# Patient Record
Sex: Male | Born: 1940 | Race: White | Hispanic: No | Marital: Married | Smoking: Never smoker
Health system: Southern US, Community
[De-identification: ages and names within clinical notes are randomized; demographics above are authoritative.]

## PROBLEM LIST (undated history)

## (undated) DIAGNOSIS — E785 Hyperlipidemia, unspecified: Secondary | ICD-10-CM

## (undated) DIAGNOSIS — I10 Essential (primary) hypertension: Secondary | ICD-10-CM

## (undated) HISTORY — DX: Hyperlipidemia, unspecified: E78.5

## (undated) HISTORY — DX: Essential (primary) hypertension: I10

## (undated) HISTORY — PX: PROSTATE CRYOABLATION: SUR358

---

## 2007-01-25 ENCOUNTER — Ambulatory Visit: Payer: Self-pay | Admitting: Urology

## 2007-03-30 ENCOUNTER — Ambulatory Visit: Payer: Self-pay | Admitting: Urology

## 2007-03-30 ENCOUNTER — Other Ambulatory Visit: Payer: Self-pay

## 2007-04-06 ENCOUNTER — Ambulatory Visit: Payer: Self-pay | Admitting: Urology

## 2008-02-02 IMAGING — NM NUCLEAR MEDICINE WHOLE BODY BONE SCINTIGRAPHY
1 series · 2 of 2 positions shown · non-contrast
Comparison: none

RESULT:      The patient received an injection of 21.4 mCi of Tc 99m MDP.
Anterior and posterior whole-body images demonstrate increased localization
diffusely in the knees and ankles suggestive of degenerative change. On the
posterior view on the left in what appears to be the 4th rib there is some
mild heterogeneity with increased localization. This is nonspecific and
certainly could be secondary to trauma or previous surgery.  The possibility
of metastatic disease in this location cannot be excluded.

[Series 1: 3 hr wholebody · 2.40mm/px · 2 of 2 frames shown]
[frame 1/2]
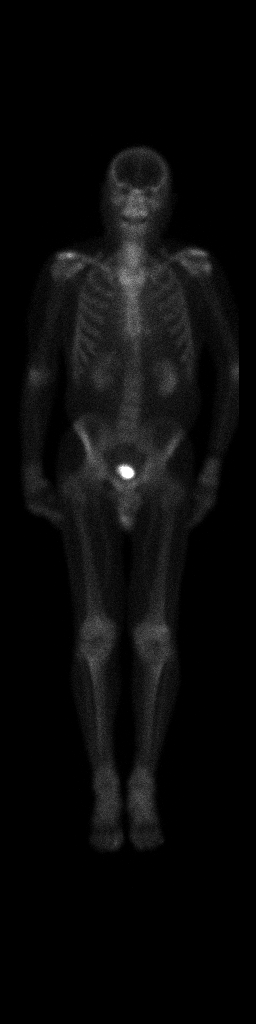
[frame 2/2]
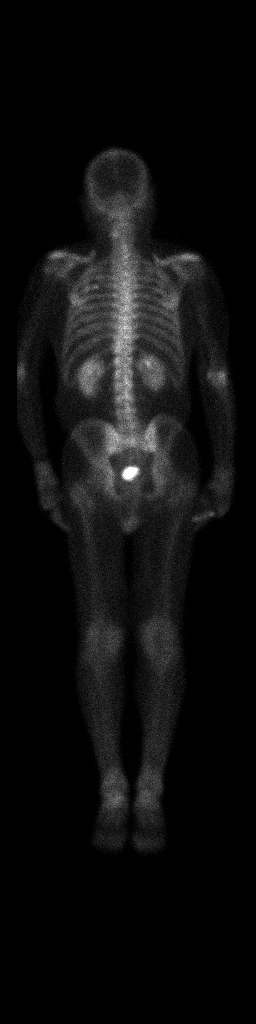

[2 of 2 positions shown; findings below may reference images not displayed]

IMPRESSION: 1.     Increased localization around the posterior left 4th rib as
described. This is nonspecific. Correlation with plain films and history may
be beneficial.

## 2008-02-10 ENCOUNTER — Emergency Department: Payer: Self-pay | Admitting: Emergency Medicine

## 2011-02-05 ENCOUNTER — Emergency Department: Payer: Self-pay | Admitting: Emergency Medicine

## 2011-02-14 ENCOUNTER — Inpatient Hospital Stay: Payer: Self-pay | Admitting: Specialist

## 2011-02-18 LAB — PATHOLOGY REPORT

## 2011-05-20 ENCOUNTER — Ambulatory Visit: Payer: Self-pay | Admitting: Unknown Physician Specialty

## 2011-05-22 LAB — PATHOLOGY REPORT

## 2011-06-16 ENCOUNTER — Ambulatory Visit: Payer: Self-pay | Admitting: Specialist

## 2011-06-23 ENCOUNTER — Ambulatory Visit: Payer: Self-pay | Admitting: Specialist

## 2012-02-22 IMAGING — CR DG CHEST 1V
1 series · 1 of 1 positions shown · non-contrast
Comparison: none

REASON FOR EXAM: tachycardia
COMMENTS:

PROCEDURE:     DXR - DXR CHEST 1 VIEWAP OR PA  - February 14, 2011  [DATE]
RESULT:     Comparison: None

[view not recorded]
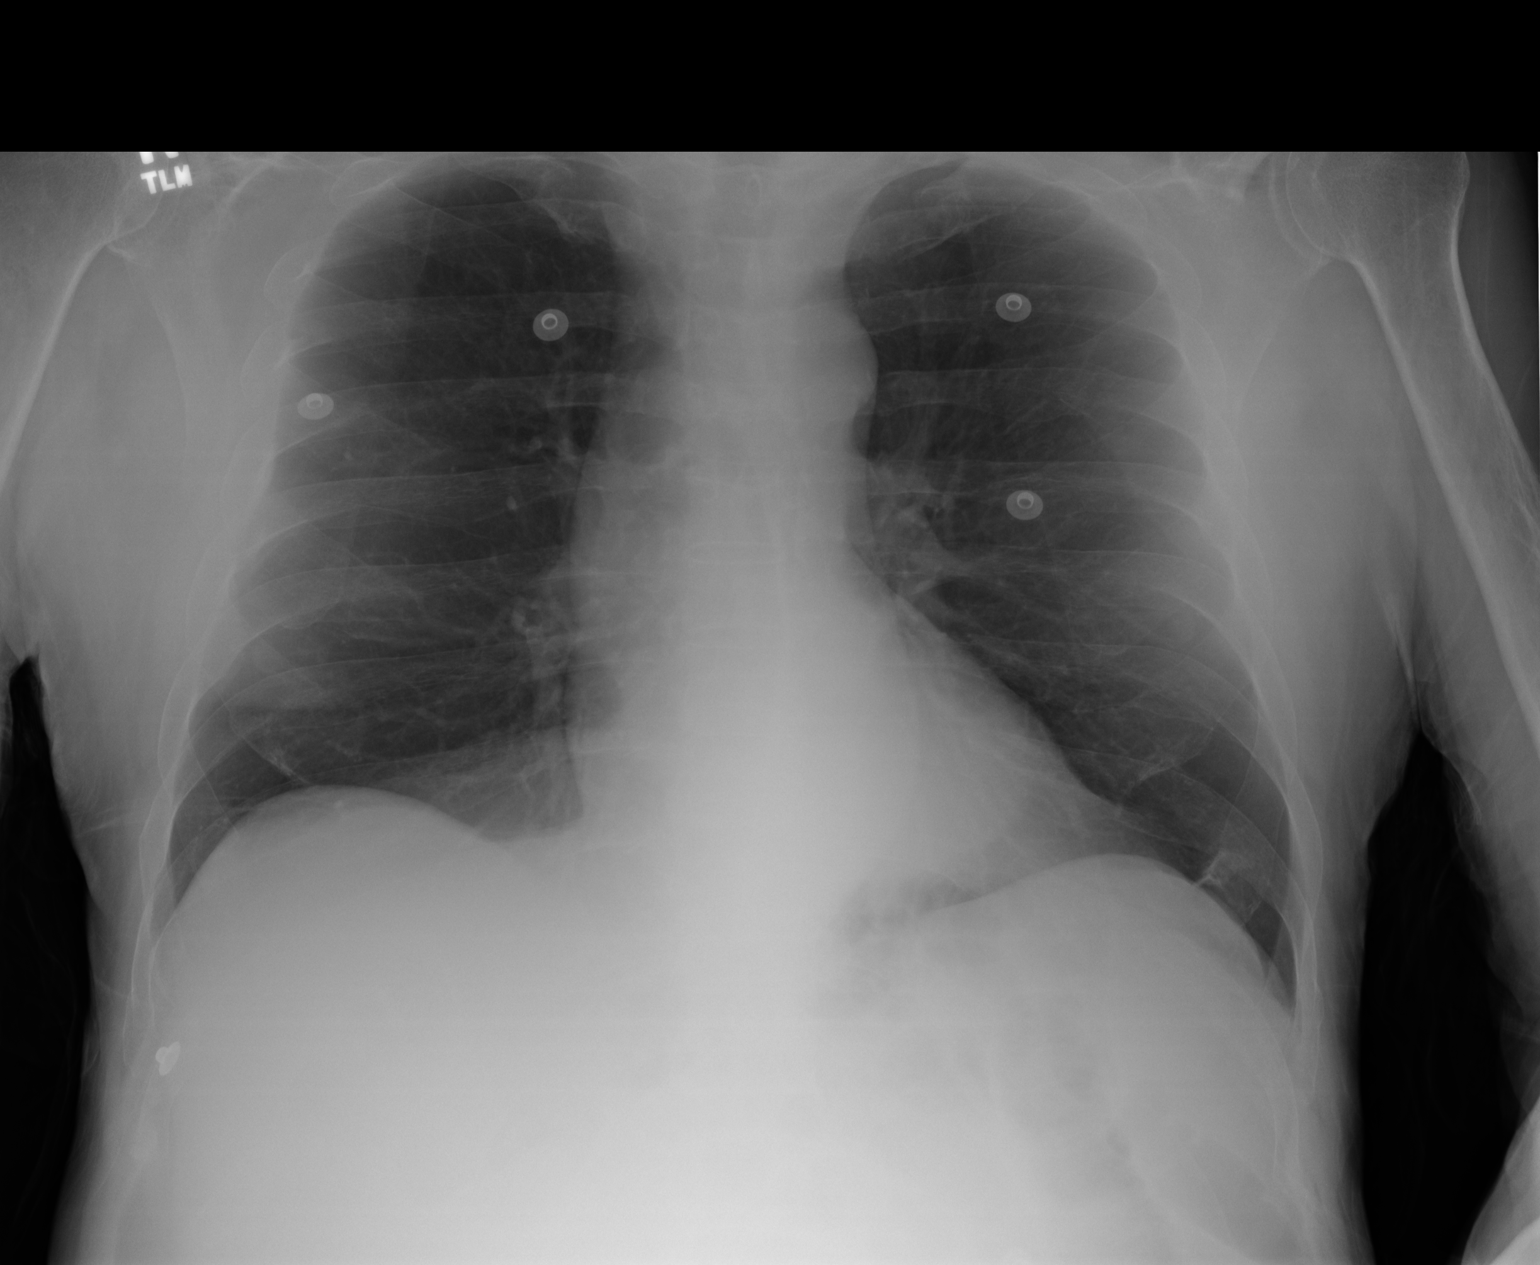

[1 of 1 positions shown; findings below may reference images not displayed]

FINDINGS: Single portable AP chest radiograph is provided.  There is no focal
parenchymal opacity, pleural effusion, or pneumothorax. Normal
cardiomediastinal silhouette. The osseous structures are unremarkable.
IMPRESSION: No acute disease of the chest.

## 2012-06-01 ENCOUNTER — Ambulatory Visit: Payer: Self-pay | Admitting: Ophthalmology

## 2012-06-01 DIAGNOSIS — I499 Cardiac arrhythmia, unspecified: Secondary | ICD-10-CM

## 2012-06-14 ENCOUNTER — Ambulatory Visit: Payer: Self-pay | Admitting: Ophthalmology

## 2012-08-03 ENCOUNTER — Ambulatory Visit: Payer: Self-pay | Admitting: Ophthalmology

## 2014-08-30 ENCOUNTER — Ambulatory Visit: Payer: Self-pay | Admitting: Unknown Physician Specialty

## 2014-08-31 LAB — PATHOLOGY REPORT

## 2015-04-03 NOTE — Op Note (Signed)
PATIENT NAME:  Jason PeterCLENDENIN, [name] MR#:  [id number] DATE OF BIRTH:  [birth year]  DATE OF PROCEDURE:  08/03/2012  PREOPERATIVE DIAGNOSIS: Visually significant cataract of the right eye.   POSTOPERATIVE DIAGNOSIS: Visually significant cataract of the right eye.   OPERATIVE PROCEDURE: Cataract extraction by phacoemulsification with implant of intraocular lens to right eye.   SURGEON: Galen ManilaWilliam Mischele Detter, MD.   ANESTHESIA:  1. Managed anesthesia care.  2. Topical tetracaine drops followed by 2% Xylocaine jelly applied in the preoperative holding area.   COMPLICATIONS: None.   TECHNIQUE:  Stop-and-chop    DESCRIPTION OF PROCEDURE: The patient was examined and consented in the preoperative holding area where the aforementioned topical anesthesia was applied to the right eye and then brought back to the Operating Room where the right eye was prepped and draped in the usual sterile ophthalmic fashion and a lid speculum was placed. A paracentesis was created with the side port blade and the anterior chamber was filled with viscoelastic. A near clear corneal incision was performed with the steel keratome. A continuous curvilinear capsulorrhexis was performed with a cystotome followed by the capsulorrhexis forceps. Hydrodissection and hydrodelineation were carried out with BSS on a blunt cannula. The lens was removed in a stop-and-chop technique and the remaining cortical material was removed with the irrigation-aspiration handpiece. The capsular bag was inflated with viscoelastic and the Technus ZCB00 19.5-diopter lens, serial number [id number] was placed in the capsular bag without complication. The remaining viscoelastic was removed from the eye with the irrigation-aspiration handpiece. The wounds were hydrated. The anterior chamber was flushed with Miostat and the eye was inflated to physiologic pressure. The wounds were found to be water tight. The eye was dressed with Vigamox. The patient was given  protective glasses to wear throughout the day and a shield with which to sleep tonight. The patient was also given drops with which to begin a drop regimen today and will follow-up with me in one day.   ____________________________ Jerilee FieldWilliam L. Seif Teichert, MD wlp:drc D: 08/03/2012 12:29:24 ET T: 08/03/2012 12:41:49 ET JOB#: [id number]  cc: Ritvik Mczeal L. Izzabella Besse, MD, <Dictator> Jerilee FieldWILLIAM L Adra Shepler MD ELECTRONICALLY SIGNED 08/05/2012 17:17

## 2015-04-08 NOTE — Op Note (Signed)
PATIENT NAME:  Jason [name], Jason [name] MR#:  [id number] DATE OF BIRTH:  1941-03-18  DATE OF PROCEDURE:  06/14/2012  PREOPERATIVE DIAGNOSIS: Visually significant cataract of the left eye.   POSTOPERATIVE DIAGNOSIS: Visually significant cataract of the left eye.   OPERATIVE PROCEDURE: Cataract extraction by phacoemulsification with implant of intraocular lens to the left eye.   SURGEON: Galen ManilaWilliam Hedy Garro, MD  ANESTHESIA:  1. Managed anesthesia care.  2. 50-50 mixture of 0.75% bupivacaine and 4% Xylocaine given as a retrobulbar block.   COMPLICATIONS: None.   TECHNIQUE:  Stop and chop.  DESCRIPTION OF PROCEDURE: The patient was examined and consented for this procedure in the preoperative holding area and then brought back to the Operating Room where the anesthesia team employed managed anesthesia care.  3.5 milliliters of the aforementioned mixture were placed in the left orbit on an Atkinson needle without complication. The left eye was then prepped and draped in the usual sterile ophthalmic fashion. A lid speculum was placed. The side-port blade was used to create a paracentesis and the anterior chamber was filled with viscoelastic. The keratome was used to create a near clear corneal incision. The continuous curvilinear capsulorrhexis was performed with a cystotome followed by the capsulorrhexis forceps. Hydrodissection and hydrodelineation were carried out with BSS on a blunt cannula. The lens was removed in a stop and chop technique. The remaining cortical material was removed with the irrigation-aspiration handpiece. The capsular bag was inflated with viscoelastic and the Technis ZCBOO 19.0-diopter lens, serial number [id number] was placed in the capsular bag without complication. The remaining viscoelastic was removed from the eye with the irrigation-aspiration handpiece. The wounds were hydrated. The anterior chamber was flushed with Miostat. The eye was inflated to a physiologic pressure and  the wounds were found to be water tight. The eye was dressed with Vigamox followed by Maxitrol ointment and a protective shield was placed.  The patient will followup with me in one day.   ____________________________ Jerilee FieldWilliam L. Nikia Levels, MD wlp:cbb D: 06/14/2012 17:36:07 ET [name]: 06/15/2012 10:07:50 ET JOB#: [id number]  cc: Zonia Caplin L. Babyboy Loya, MD, <Dictator> Jerilee FieldWILLIAM L Vanissa Strength MD ELECTRONICALLY SIGNED 06/23/2012 9:58

## 2017-06-08 ENCOUNTER — Ambulatory Visit (INDEPENDENT_AMBULATORY_CARE_PROVIDER_SITE_OTHER): Payer: Medicare Other | Admitting: Urology

## 2017-06-08 ENCOUNTER — Encounter: Payer: Self-pay | Admitting: Urology

## 2017-06-08 VITALS — BP 162/77 | HR 78 | Ht 65.0 in | Wt 141.3 lb

## 2017-06-08 DIAGNOSIS — N390 Urinary tract infection, site not specified: Secondary | ICD-10-CM | POA: Diagnosis not present

## 2017-06-08 LAB — URINALYSIS, COMPLETE
Bilirubin, UA: NEGATIVE
GLUCOSE, UA: NEGATIVE
KETONES UA: NEGATIVE
LEUKOCYTES UA: NEGATIVE
NITRITE UA: NEGATIVE
Protein, UA: NEGATIVE
RBC UA: NEGATIVE
SPEC GRAV UA: 1.015 (ref 1.005–1.030)
Urobilinogen, Ur: 0.2 mg/dL (ref 0.2–1.0)
pH, UA: 5.5 (ref 5.0–7.5)

## 2017-06-08 NOTE — Progress Notes (Signed)
06/08/2017 2:02 PM   Jason PeterKenneth T [name] [birth year] [id number]  Referring provider: No referring provider defined for this encounter.  Chief Complaint  Patient presents with  . Recurrent UTI    HPI: The patient is followed by Dr. Achilles Dunkope who we just saw 2 weeks ago. He describes a bladder infection in April with dysuria and severe frequency. He developed a reaction to ciprofloxacin. 2 weeks later he had nonspecific penile pain with foul smelling urine I responded to a second antibiotic.  He had cryotherapy for prostate cancer [facility] and recently had a PSA.  At baseline he voids every 2 or 3 hours and gets up once or twice a night. His primary complaint today is more of a tangle in the penis after he urinates or during urination  Modifying factors: There are no other modifying factors  Associated signs and symptoms: There are no other associated signs and symptoms Aggravating and relieving factors: There are no other aggravating or relieving factors Severity: Moderate Duration: Persistent     PMH: Past Medical History:  Diagnosis Date  . Hyperlipidemia   . Hypertension     Surgical History: Past Surgical History:  Procedure Laterality Date  . PROSTATE CRYOABLATION      Home Medications:  Allergies as of 06/08/2017      Reactions   Ace Inhibitors    Other reaction(s): Other (See Comments) Increases potassium Pt states he is not allergic to these   Ciprofloxacin Other (See Comments)   Hallucinations   Influenza Vaccines Other (See Comments)   Felt horrible      Medication List       Accurate as of 06/08/17  2:02 PM. Always use your most recent med list.          metoprolol tartrate 50 MG tablet Commonly known as:  LOPRESSOR Take 50 mg by mouth 2 (two) times daily.   sildenafil 20 MG tablet Commonly known as:  REVATIO Take 20 mg by mouth 3 (three) times daily.   simvastatin 40 MG tablet Commonly known as:   ZOCOR Take 40 mg by mouth daily.       Allergies:  Allergies  Allergen Reactions  . Ace Inhibitors     Other reaction(s): Other (See Comments) Increases potassium  Pt states he is not allergic to these  . Ciprofloxacin Other (See Comments)    Hallucinations  . Influenza Vaccines Other (See Comments)    Felt horrible    Family History: Family History  Problem Relation Age of Onset  . Prostate cancer Neg Hx   . Bladder Cancer Neg Hx   . Kidney cancer Neg Hx     Social History:  reports that he has never smoked. He has never used smokeless tobacco. He reports that he does not drink alcohol or use drugs.  ROS: UROLOGY Frequent Urination?: No Hard to postpone urination?: No Burning/pain with urination?: No Get up at night to urinate?: No Leakage of urine?: No Urine stream starts and stops?: No Trouble starting stream?: No Do you have to strain to urinate?: No Blood in urine?: No Urinary tract infection?: Yes Sexually transmitted disease?: No Injury to kidneys or bladder?: No Painful intercourse?: No Weak stream?: No Erection problems?: Yes Penile pain?: Yes  Gastrointestinal Nausea?: Yes Vomiting?: Yes Indigestion/heartburn?: Yes Diarrhea?: Yes Constipation?: No  Constitutional Fever: No Night sweats?: No Weight loss?: No Fatigue?: No  Skin Skin rash/lesions?: No Itching?: No  Eyes Blurred vision?: No  Double vision?: No  Ears/Nose/Throat Sore throat?: No Sinus problems?: No  Hematologic/Lymphatic Swollen glands?: No Easy bruising?: No  Cardiovascular Leg swelling?: No Chest pain?: No  Respiratory Cough?: No Shortness of breath?: No  Endocrine Excessive thirst?: No  Musculoskeletal Back pain?: No Joint pain?: No  Neurological Headaches?: No Dizziness?: No  Psychologic Depression?: No Anxiety?: No  Physical Exam: BP (!) 162/77 (BP Location: Left Arm, Patient Position: Sitting, Cuff Size: Normal)   Pulse 78   Ht 5\' 5"   (1.651 m)   Wt 141 lb 4.8 oz (64.1 kg)   BMI 23.51 kg/m   Constitutional:  Alert and oriented, No acute distress. HEENT: [place] AT, moist mucus membranes.  Trachea midline, no masses. Cardiovascular: No clubbing, cyanosis, or edema. Respiratory: Normal respiratory effort, no increased work of breathing. GI: Abdomen is soft, nontender, nondistended, no abdominal masses GU: No CVA tenderness. Normal genitalia Skin: No rashes, bruises or suspicious lesions. Lymph: No cervical or inguinal adenopathy. Neurologic: Grossly intact, no focal deficits, moving all 4 extremities. Psychiatric: Normal mood and affect.  Laboratory Data:  Urinalysis No results found for: COLORURINE, APPEARANCEUR, LABSPEC, PHURINE, GLUCOSEU, HGBUR, BILIRUBINUR, KETONESUR, PROTEINUR, UROBILINOGEN, NITRITE, LEUKOCYTESUR  Pertinent Imaging: none  Assessment & Plan:  The patient describes to bladder infections. The second may have been from incomplete treatment of the first. 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 I will not work him up since he has his own urologist who mentioned that he might perform cystoscopy in the future. I think this would be most efficient for the patient as well. We will be happy to see him whenever necessary if were able to see him more acutely and locally. See PRN  1. Recurrent UTI 2. Prostate cancer - Urinalysis, Complete   No Follow-up on file.  Martina Sinner, MD  [facility] [address] [place], [place] [address] [phone]

## 2017-06-30 ENCOUNTER — Other Ambulatory Visit: Payer: Self-pay

## 2019-09-22 ENCOUNTER — Other Ambulatory Visit
Admission: RE | Admit: 2019-09-22 | Discharge: 2019-09-22 | Disposition: A | Discharge status: Home / Self Care | Payer: Medicare Other | Attending: Family Medicine | Admitting: Family Medicine

## 2019-09-22 NOTE — ED Notes (Signed)
Forensic blood drawn per policy from right [facility] and cleansed with betadine. Specimen released to Officer Eakly with [employer]. Pt cooperative and consent for blood draw signed by pt.

## 2020-04-25 ENCOUNTER — Ambulatory Visit (INDEPENDENT_AMBULATORY_CARE_PROVIDER_SITE_OTHER): Payer: Medicare Other | Admitting: Urology

## 2020-04-25 ENCOUNTER — Other Ambulatory Visit: Payer: Self-pay

## 2020-04-25 ENCOUNTER — Encounter: Payer: Self-pay | Admitting: Urology

## 2020-04-25 VITALS — BP 180/90 | HR 80 | Ht 66.0 in | Wt 145.0 lb

## 2020-04-25 DIAGNOSIS — R351 Nocturia: Secondary | ICD-10-CM

## 2020-04-25 DIAGNOSIS — Z8546 Personal history of malignant neoplasm of prostate: Secondary | ICD-10-CM

## 2020-04-25 MED ORDER — TROSPIUM CHLORIDE 20 MG PO TABS: 20.0000 mg | ORAL_TABLET | Freq: Every day | ORAL | 0 refills | Status: DC

## 2020-04-25 NOTE — Progress Notes (Signed)
04/25/20 3:12 PM   Jason [name] [birth year] [id number]  [name], MD No address on file Chief Complaint  Patient presents with  . Follow-up    HPI: Jason [name] is a 79 y.o. white male who presents for evaluation of nocturia.   - [name] with normal voided volumes started 6 months ago - Drinks last liquid by 7 pm  - Denies day time frequency or urgency -Denies incontinence - Denies other urinary symptoms - Denies alcohol consumption or smoking - Denies history of sleep apnea/snoring  -History [name] -[name] in 2018 for UTI   PMH: Past Medical History:  Diagnosis Date  . Hyperlipidemia   . Hypertension     Surgical History: Past Surgical History:  Procedure Laterality Date  . PROSTATE CRYOABLATION      Home Medications:  Allergies as of 04/25/2020      Reactions   Ace Inhibitors    Other reaction(s): Other (See Comments) Increases potassium Pt states he is not allergic to these   Ciprofloxacin Other (See Comments)   Hallucinations   Influenza Vaccines Other (See Comments)   Felt horrible      Medication List       Accurate as of Apr 25, 2020  3:12 PM. If you have any questions, ask your nurse or doctor.        STOP taking these medications   sildenafil 20 MG tablet Commonly known as: REVATIO Stopped by: [name], MD     TAKE these medications   metoprolol succinate 50 MG 24 hr tablet Commonly known as: TOPROL-XL Take by mouth.   metoprolol tartrate 50 MG tablet Commonly known as: LOPRESSOR Take 50 mg by mouth 2 (two) times daily.   simvastatin 40 MG tablet Commonly known as: ZOCOR Take 40 mg by mouth daily.   tadalafil 20 MG tablet Commonly known as: CIALIS Take by mouth.       Allergies:  Allergies  Allergen Reactions  . Ace Inhibitors     Other reaction(s): Other (See Comments) Increases potassium  Pt states he  is not allergic to these  . Ciprofloxacin Other (See Comments)    Hallucinations  . Influenza Vaccines Other (See Comments)    Felt horrible    Family History: Family History  Problem Relation Age of Onset  . Prostate cancer Neg Hx   . Bladder Cancer Neg Hx   . Kidney cancer Neg Hx     Social History:  reports that he has never smoked. He has never used smokeless tobacco. He reports that he does not drink alcohol or use drugs.   Physical Exam: BP (!) 180/90   Pulse 80   Ht 5\' 6"  (1.676 m)   Wt 145 lb (65.8 kg)   BMI 23.40 kg/m   Constitutional:  Alert and oriented, No acute distress. HEENT: [place] AT, moist mucus membranes.  Trachea midline, no masses. Cardiovascular: No clubbing, cyanosis, or edema. Respiratory: Normal respiratory effort, no increased work of breathing. Skin: No rashes, bruises or suspicious lesions. Neurologic: Grossly intact, no focal deficits, moving all 4 extremities. Psychiatric: Normal mood and affect.  Laboratory Data:  Urinalysis Dipstick/microscopy unremarkable    Assessment & Plan: 1. Nocturia -New problem -Discussed potential etiologies including nocturnal polyuria, bladder overactivity and sleep apnea -Initial trial trospium 20 mg at bedtime daily - Call back in a month regarding efficacy -Consider Nocdurna if no improvement  [facility]  [address], [address] [place], [place] [address] [phone]  I, [name], am acting as a Neurosurgeon for Dr. Lorin Picket C. Raynesha Tiedt.   I have reviewed the above documentation for accuracy and completeness, and I agree with the above.   Riki Altes, MD

## 2020-04-26 LAB — MICROSCOPIC EXAMINATION
Bacteria, UA: NONE SEEN
Epithelial Cells (non renal): NONE SEEN /hpf (ref 0–10)
WBC, UA: NONE SEEN /hpf (ref 0–5)

## 2020-04-26 LAB — URINALYSIS, COMPLETE
Bilirubin, UA: NEGATIVE
Leukocytes,UA: NEGATIVE
Nitrite, UA: NEGATIVE
RBC, UA: NEGATIVE
Specific Gravity, UA: 1.025 (ref 1.005–1.030)
Urobilinogen, Ur: 0.2 mg/dL (ref 0.2–1.0)
pH, UA: 5.5 (ref 5.0–7.5)

## 2020-04-27 ENCOUNTER — Encounter: Payer: Self-pay | Admitting: Urology

## 2020-04-27 ENCOUNTER — Other Ambulatory Visit: Payer: Self-pay

## 2020-04-27 MED ORDER — TROSPIUM CHLORIDE 20 MG PO TABS: 20.0000 mg | ORAL_TABLET | Freq: Every day | ORAL | 0 refills | Status: DC

## 2020-05-29 ENCOUNTER — Telehealth: Payer: Self-pay | Admitting: *Deleted

## 2020-05-29 NOTE — Telephone Encounter (Signed)
We had discussed Nocdurna however on reviewing his chart his kidney function is slightly abnormal and this medication is contraindicated.  The only other common cause of getting up at night is undiagnosed sleep apnea and he could get a sleep study ordered by his PCP

## 2020-05-29 NOTE — Telephone Encounter (Signed)
Patient called in today and states the trospium 20 mg,  is not helping .Is there anything else that may help?

## 2020-05-29 NOTE — Telephone Encounter (Signed)
[facility] please

## 2020-05-30 NOTE — Telephone Encounter (Signed)
Notified patient as instructed, patient pleased. Discussed follow-up appointments, patient agrees  

## 2020-05-30 NOTE — Telephone Encounter (Signed)
Patient left mess returning your call

## 2020-07-09 ENCOUNTER — Other Ambulatory Visit: Payer: Self-pay | Admitting: Urology

## 2021-01-10 ENCOUNTER — Other Ambulatory Visit: Payer: Self-pay

## 2021-01-10 ENCOUNTER — Ambulatory Visit (INDEPENDENT_AMBULATORY_CARE_PROVIDER_SITE_OTHER): Payer: Medicare Other | Admitting: Physician Assistant

## 2021-01-10 ENCOUNTER — Ambulatory Visit: Payer: Medicare Other | Admitting: Physician Assistant

## 2021-01-10 DIAGNOSIS — R339 Retention of urine, unspecified: Secondary | ICD-10-CM | POA: Diagnosis not present

## 2021-01-10 LAB — BLADDER SCAN AMB NON-IMAGING

## 2021-01-10 NOTE — Progress Notes (Signed)
01/10/2021 9:02 AM   Jason [name] 12-31-1940 [id number]  CC: Chief Complaint  Patient presents with  . Urinary Retention    Voiding trial    HPI: Jason [name] is a 80 y.o. male with PMH prostate cancer s/p cryoablation in 2008 by Dr. Achilles [name] and nocturia on trospium who was seen in the emergency department at [facility] on 01/05/2021 for acute urinary retention with bladder scan 489 mL.  Foley catheter was placed and he presents today for voiding trial.  Today, patient reports he believes his recent episode of urinary retention may have been medication induced.  He takes Tylenol PM (acetamide + diphenhydramine) nightly as well as intranasal antihistamine spray and nightly trospium.  The trospium has been helping with his nocturia, however he states that the night before he went into urinary retention, he may have taken 2 doses by accident.  He noticed some constipation in the days following Foley catheterization it has since resolved.  He has stopped all antihistamines and trospium since his ED visit.  Patient has a history of occasional UTI.  No UA performed in the emergency department, however patient denies dysuria, urgency, frequency, or gross hematuria leading up to his recent episode of retention.  He denies recent back pain, fatigue, or weight loss.  He states he has been voiding without difficulty until he suddenly became unable to void on the 22nd.   Last PSA dated 01/27/2019 was 0.22.  PMH: Past Medical History:  Diagnosis Date  . Hyperlipidemia   . Hypertension     Surgical History: Past Surgical History:  Procedure Laterality Date  . PROSTATE CRYOABLATION      Home Medications:  Allergies as of 01/10/2021      Reactions   Ace Inhibitors    Other reaction(s): Other (See Comments) Increases potassium Pt states he is not allergic to these   Ciprofloxacin Other (See Comments)   Hallucinations   Influenza Vaccines Other (See Comments)   Felt horrible       Medication List       Accurate as of January 10, 2021  9:02 AM. If you have any questions, ask your nurse or doctor.        metoprolol succinate 50 MG 24 hr tablet Commonly known as: TOPROL-XL Take by mouth.   metoprolol tartrate 50 MG tablet Commonly known as: LOPRESSOR Take 50 mg by mouth 2 (two) times daily.   simvastatin 40 MG tablet Commonly known as: ZOCOR Take 40 mg by mouth daily.   tadalafil 20 MG tablet Commonly known as: CIALIS Take by mouth.   trospium 20 MG tablet Commonly known as: SANCTURA TAKE 1 TABLET AT BEDTIME       Allergies:  Allergies  Allergen Reactions  . Ace Inhibitors     Other reaction(s): Other (See Comments) Increases potassium  Pt states he is not allergic to these  . Ciprofloxacin Other (See Comments)    Hallucinations  . Influenza Vaccines Other (See Comments)    Felt horrible    Family History: Family History  Problem Relation Age of Onset  . Prostate cancer Neg Hx   . Bladder Cancer Neg Hx   . Kidney cancer Neg Hx     Social History:   reports that he has never smoked. He has never used smokeless tobacco. He reports that he does not drink alcohol and does not use drugs.  Physical Exam: There were no vitals taken for this visit.  Constitutional:  Alert and oriented, no  acute distress, nontoxic appearing HEENT: [place], AT Cardiovascular: No clubbing, cyanosis, or edema Respiratory: Normal respiratory effort, no increased work of breathing Skin: No rashes, bruises or suspicious lesions Neurologic: Grossly intact, no focal deficits, moving all 4 extremities Psychiatric: Anxious mood and affect  Laboratory Data: Results for orders placed or performed in visit on 01/10/21  Bladder Scan (Post Void Residual) in office  Result Value Ref Range   Scan Result 59mL    Assessment & Plan:   1. Urinary retention Catheter removed in the morning, see separate procedure note for details. Patient returned to clinic this afternoon  for repeat PVR. He reports drinking approximately 40oz of fluid. He has been able to urinate. PVR 72mL.  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 Counseled patient to stay off Tylenol PM, trospium, and intranasal antihistamines. Will plan for symptom recheck with PVR with Jason [name] in 1 month and consider restarting an OAB med at that time. - Bladder Scan (Post Void Residual) in office   Return in about 4 weeks (around 02/07/2021) for Symptom recheck with PVR with Jason [name].   I spent 45 minutes on the day of the encounter to include pre-visit record review, face-to-face time with the patient, and post-visit ordering of tests.   Jason Ching, PA-C  [facility] [address], [address] [place], [place] [address] [phone]

## 2021-02-04 ENCOUNTER — Telehealth: Payer: Self-pay

## 2021-02-04 NOTE — Telephone Encounter (Signed)
Can restart since appointment is later this week

## 2021-02-04 NOTE — Telephone Encounter (Signed)
Notified patient as instructed, patient pleased. Discussed follow-up appointments, patient agrees  

## 2021-02-04 NOTE — Telephone Encounter (Signed)
Patient called stating that he would like to try and restart the Trospium. He was counseled per Sam's last note to stay off until symptom recheck with PVR with Dr. Lonna Cobb in 1 month and consider restarting an OAB med at that time. Patient states his symptoms are starting to worsen over night. And he believes it was the combination Tylenol PM, trospium, and intranasal antihistamines that caused his issues. Is it ok for him to restart or should he wait for his visit Friday to discuss further? Please advise

## 2021-02-08 ENCOUNTER — Ambulatory Visit (INDEPENDENT_AMBULATORY_CARE_PROVIDER_SITE_OTHER): Payer: Medicare Other | Admitting: Urology

## 2021-02-08 ENCOUNTER — Encounter: Payer: Self-pay | Admitting: Urology

## 2021-02-08 ENCOUNTER — Other Ambulatory Visit: Payer: Self-pay

## 2021-02-08 VITALS — BP 176/104 | HR 99 | Ht 66.0 in | Wt 144.0 lb

## 2021-02-08 DIAGNOSIS — R339 Retention of urine, unspecified: Secondary | ICD-10-CM

## 2021-02-08 LAB — BLADDER SCAN AMB NON-IMAGING: Scan Result: 200

## 2021-02-08 NOTE — Progress Notes (Signed)
   02/08/2021 9:41 AM   Jason [name] 20-Sep-1941 [id number]  Referring provider: Sharilyn Sites, MD No address on file  Chief Complaint  Patient presents with  . Follow-up    follow-up     HPI: 80 y.o. male presents for follow-up of urinary retention.   Refer to Sam Vaillancourt's note of 01/10/2021  Follow-up PVR was 35 mL  His nocturia did recur and he started trospium back earlier this week  Has noted some daytime frequency  Has eliminated caffeine in his diet   PMH: Past Medical History:  Diagnosis Date  . Hyperlipidemia   . Hypertension     Surgical History: Past Surgical History:  Procedure Laterality Date  . PROSTATE CRYOABLATION      Home Medications:  Allergies as of 02/08/2021      Reactions   Ace Inhibitors    Other reaction(s): Other (See Comments) Increases potassium Pt states he is not allergic to these   Ciprofloxacin Other (See Comments)   Hallucinations   Influenza Vaccines Other (See Comments)   Felt horrible      Medication List       Accurate as of February 08, 2021  9:41 AM. If you have any questions, ask your nurse or doctor.        metoprolol succinate 50 MG 24 hr tablet Commonly known as: TOPROL-XL Take by mouth.   metoprolol tartrate 50 MG tablet Commonly known as: LOPRESSOR Take 50 mg by mouth 2 (two) times daily.   simvastatin 40 MG tablet Commonly known as: ZOCOR Take 40 mg by mouth daily.   tadalafil 20 MG tablet Commonly known as: CIALIS Take by mouth.   trospium 20 MG tablet Commonly known as: SANCTURA TAKE 1 TABLET AT BEDTIME       Allergies:  Allergies  Allergen Reactions  . Ace Inhibitors     Other reaction(s): Other (See Comments) Increases potassium  Pt states he is not allergic to these  . Ciprofloxacin Other (See Comments)    Hallucinations  . Influenza Vaccines Other (See Comments)    Felt horrible    Family History: Family History  Problem Relation Age of Onset  .  Prostate cancer Neg Hx   . Bladder Cancer Neg Hx   . Kidney cancer Neg Hx     Social History:  reports that he has never smoked. He has never used smokeless tobacco. He reports that he does not drink alcohol and does not use drugs.   Physical Exam: BP (!) 176/104   Pulse 99   Ht 5\' 6"  (1.676 m)   Wt 144 lb (65.3 kg)   BMI 23.24 kg/m   Constitutional:  Alert and oriented, No acute distress. HEENT: [place] AT, moist mucus membranes.  Trachea midline, no masses. Cardiovascular: No clubbing, cyanosis, or edema. Respiratory: Normal respiratory effort, no increased work of breathing.   Assessment & Plan:    1.  Incomplete bladder emptying  Bladder scan PVR today was 200 mL  He is also having some daytime frequency and urgency which may be related to his increased residual  Discontinue trospium  Trial Myrbetriq 25 mg daily-samples given  Follow-up 1 month for symptom recheck and repeat PVR   , MD  [facility] [address] [place], [place] [phone]

## 2021-02-19 ENCOUNTER — Other Ambulatory Visit: Payer: Self-pay | Admitting: *Deleted

## 2021-02-19 MED ORDER — MIRABEGRON ER 25 MG PO TB24: 25.0000 mg | ORAL_TABLET | Freq: Every day | ORAL | 3 refills | Status: AC

## 2021-02-21 ENCOUNTER — Telehealth: Payer: Self-pay | Admitting: *Deleted

## 2021-02-21 NOTE — Telephone Encounter (Signed)
Myrbetriq was denise. .Paper states patient needs to try [facility] floor training.

## 2021-02-22 ENCOUNTER — Telehealth: Payer: Self-pay

## 2021-02-22 NOTE — Telephone Encounter (Signed)
Talked with insurance they denied myrbetriq. Advised patient.

## 2021-02-22 NOTE — Telephone Encounter (Signed)
Incoming call from pt on triage line stating that he was told his prior authorization for Myrbetriq was denied however he called his insurance company ([employer]) and they state that they have not opened a PA for myrbetriq and are still waiting. PA can be initiated by calling [phone].

## 2021-02-22 NOTE — Telephone Encounter (Addendum)
Notified patient as instructed,.  

## 2021-03-15 ENCOUNTER — Ambulatory Visit: Payer: Medicare Other | Admitting: Urology
